# Patient Record
Sex: Female | Born: 1985 | Hispanic: Yes | Marital: Single | Smoking: Never smoker
Health system: Southern US, Community
[De-identification: ages and names within clinical notes are randomized; demographics above are authoritative.]

## PROBLEM LIST (undated history)

## (undated) HISTORY — PX: CHOLECYSTECTOMY: SHX55

---

## 2015-02-06 ENCOUNTER — Emergency Department
Admission: EM | Admit: 2015-02-06 | Discharge: 2015-02-06 | Disposition: A | Discharge status: Home / Self Care | Payer: Self-pay | Attending: Emergency Medicine | Admitting: Emergency Medicine

## 2015-02-06 ENCOUNTER — Encounter: Payer: Self-pay | Admitting: Emergency Medicine

## 2015-02-06 ENCOUNTER — Emergency Department: Payer: Self-pay

## 2015-02-06 DIAGNOSIS — Z3202 Encounter for pregnancy test, result negative: Secondary | ICD-10-CM | POA: Insufficient documentation

## 2015-02-06 DIAGNOSIS — R109 Unspecified abdominal pain: Secondary | ICD-10-CM

## 2015-02-06 DIAGNOSIS — K2970 Gastritis, unspecified, without bleeding: Secondary | ICD-10-CM | POA: Insufficient documentation

## 2015-02-06 DIAGNOSIS — F419 Anxiety disorder, unspecified: Secondary | ICD-10-CM | POA: Insufficient documentation

## 2015-02-06 LAB — URINALYSIS COMPLETE WITH MICROSCOPIC (ARMC ONLY)
BACTERIA UA: NONE SEEN
BILIRUBIN URINE: NEGATIVE
GLUCOSE, UA: NEGATIVE mg/dL
Hgb urine dipstick: NEGATIVE
Ketones, ur: NEGATIVE mg/dL
LEUKOCYTES UA: NEGATIVE
NITRITE: NEGATIVE
Protein, ur: NEGATIVE mg/dL
Specific Gravity, Urine: 1.024 (ref 1.005–1.030)
pH: 5 (ref 5.0–8.0)

## 2015-02-06 LAB — COMPREHENSIVE METABOLIC PANEL
ALBUMIN: 4.4 g/dL (ref 3.5–5.0)
ALT: 47 U/L (ref 14–54)
AST: 175 U/L — ABNORMAL HIGH (ref 15–41)
Alkaline Phosphatase: 80 U/L (ref 38–126)
Anion gap: 6 (ref 5–15)
BILIRUBIN TOTAL: 0.6 mg/dL (ref 0.3–1.2)
BUN: 15 mg/dL (ref 6–20)
CHLORIDE: 106 mmol/L (ref 101–111)
CO2: 26 mmol/L (ref 22–32)
CREATININE: 0.63 mg/dL (ref 0.44–1.00)
Calcium: 9.1 mg/dL (ref 8.9–10.3)
GLUCOSE: 107 mg/dL — AB (ref 65–99)
Potassium: 3.9 mmol/L (ref 3.5–5.1)
Sodium: 138 mmol/L (ref 135–145)
Total Protein: 8.3 g/dL — ABNORMAL HIGH (ref 6.5–8.1)

## 2015-02-06 LAB — CBC WITH DIFFERENTIAL/PLATELET
BASOS ABS: 0 10*3/uL (ref 0–0.1)
BASOS PCT: 0 %
EOS ABS: 0.2 10*3/uL (ref 0–0.7)
Eosinophils Relative: 2 %
HCT: 39 % (ref 35.0–47.0)
Hemoglobin: 12.7 g/dL (ref 12.0–16.0)
Lymphocytes Relative: 22 %
Lymphs Abs: 2.2 10*3/uL (ref 1.0–3.6)
MCH: 28.9 pg (ref 26.0–34.0)
MCHC: 32.6 g/dL (ref 32.0–36.0)
MCV: 88.7 fL (ref 80.0–100.0)
Monocytes Absolute: 0.6 10*3/uL (ref 0.2–0.9)
Monocytes Relative: 6 %
NEUTROS ABS: 7.1 10*3/uL — AB (ref 1.4–6.5)
NEUTROS PCT: 70 %
PLATELETS: 303 10*3/uL (ref 150–440)
RBC: 4.4 MIL/uL (ref 3.80–5.20)
RDW: 13.3 % (ref 11.5–14.5)
WBC: 10 10*3/uL (ref 3.6–11.0)

## 2015-02-06 LAB — POCT PREGNANCY, URINE: Preg Test, Ur: NEGATIVE

## 2015-02-06 LAB — LIPASE, BLOOD: LIPASE: 35 U/L (ref 22–51)

## 2015-02-06 MED ORDER — ONDANSETRON HCL 4 MG/2ML IJ SOLN
4.0000 mg | Freq: Once | INTRAMUSCULAR | Status: AC
  Administered 2015-02-06: 4 mg via INTRAVENOUS

## 2015-02-06 MED ORDER — SODIUM CHLORIDE 0.9 % IV SOLN
1000.0000 mL | Freq: Once | INTRAVENOUS | Status: AC
  Administered 2015-02-06: 1000 mL via INTRAVENOUS

## 2015-02-06 MED ORDER — ONDANSETRON HCL 4 MG/2ML IJ SOLN
INTRAMUSCULAR | Status: AC
  Administered 2015-02-06: 4 mg via INTRAVENOUS
  Filled 2015-02-06: qty 2

## 2015-02-06 MED ORDER — GI COCKTAIL ~~LOC~~
ORAL | Status: AC
  Administered 2015-02-06: 30 mL via ORAL
  Filled 2015-02-06: qty 30

## 2015-02-06 MED ORDER — MORPHINE SULFATE 4 MG/ML IJ SOLN
INTRAMUSCULAR | Status: AC
  Administered 2015-02-06: 4 mg via INTRAVENOUS
  Filled 2015-02-06: qty 1

## 2015-02-06 MED ORDER — MORPHINE SULFATE 4 MG/ML IJ SOLN
4.0000 mg | Freq: Once | INTRAMUSCULAR | Status: AC
  Administered 2015-02-06: 4 mg via INTRAVENOUS

## 2015-02-06 MED ORDER — PANTOPRAZOLE SODIUM 20 MG PO TBEC: 20.0000 mg | DELAYED_RELEASE_TABLET | Freq: Every day | ORAL | Status: AC

## 2015-02-06 MED ORDER — GI COCKTAIL ~~LOC~~
30.0000 mL | Freq: Once | ORAL | Status: AC
  Administered 2015-02-06: 30 mL via ORAL

## 2015-02-06 NOTE — Discharge Instructions (Signed)
Gastritis - Adultos  °(Gastritis, Adult) ° La gastrittis es la irritación (inflamación) de la membrana interna del estómago. Puede ser una enfermedad de inicio súbito (aguda) o de largo plazo (crónica). Si la gastritis no se trata, puede causar sangrado y úlceras. °CAUSAS  °La gastritis se produce cuando la membrana que tapiza interiormente al estómago se debilita o se daña. Los jugos digestivos del estómago inflaman el revestimiento del estómago debilitado. El revestimiento del estómago puede debilitarse o dañarse por una infección viral o bacteriana. La infección bacteriana más común es la infección por Helicobacter pylori. También puede ser el resultado del consumo excesivo de alcohol, por el uso de ciertos medicamentos o porque hay demasiado ácido en el estómago.  °SÍNTOMAS  °En algunos casos no hay síntomas. Si se presentan síntomas, éstos pueden ser:  °· Dolor o sensación de ardor en la parte superior del abdomen. °· Náuseas. °· Vómitos. °· Sensación molesta de distensión después de comer. °DIAGNÓSTICO  °El médico puede diagnosticar gastritis según los síntomas y el examen físico. Para determinar la causa de la gastritis, el médico podrá:  °· Pedir análisis de sangre o de materia fecal para diagnosticar la presencia de la bacteria H pylori. °· Gastroscopía. Un tubo delgado y flexible (endoscopio) se pasa por el esófago hasta llegar al estómago. El endoscopio tiene una luz y una cámara en el extremo. El médico utilizará el endoscopio para observar el interior del estómago. °· Tomará una muestra de tejido (biopsia) del estómago para examinarlo en el microscopio. °TRATAMIENTO  °Según la causa de la gastritis podrán recetarle: Antibióticos, si la causa es una infección bacteriana, como una infección por H. pylori. Antiácidos o bloqueadores H2, si hay demasiado ácido en el estómago. El médico le aconsejará que deje de tomar aspirina, ibuprofeno u otros antiinflamatorios no esteroides (AINE).  °INSTRUCCIONES PARA EL  CUIDADO EN EL HOGAR  °· Tome sólo medicamentos de venta libre o recetados, según las indicaciones del médico. °· Si le han recetado antibióticos, tómelos según las indicaciones. Tómelos todos, aunque se sienta mejor. °· Debe ingerir gran cantidad de líquido para mantener la orina de tono claro o color amarillo pálido. °· Evite las comidas y bebidas que empeoran los problemas, como: °¨ Bebidas con cafeína o alcohólicas. °¨ Chocolate. °¨ Sabores a menta. °¨ Ajo y cebolla. °¨ Comidas muy condimentadas. °¨ Cítricos como naranjas, limones o limas. °¨ Alimentos que contengan tomate, como salsas, chile y pizza. °¨ Alimentos fritos y grasos. °· Haga comidas pequeñas durante el día en lugar de 3 comidas abundantes. °SOLICITE ATENCIÓN MÉDICA DE INMEDIATO SI:  °· La materia fecal es negra o de color rojo oscuro. °· Vomita sangre de color rojo brillante o material similar a granos de café. °· No puede retener los líquidos. °· El dolor abdominal empeora. °· Tiene fiebre. °· No mejora luego de 1 semana. °· Tiene preguntas o preocupaciones. °ASEGÚRESE DE QUE:  °· Comprende estas instrucciones. °· Controlará su enfermedad. °· Solicitará ayuda de inmediato si no mejora o si empeora. °Document Released: 05/22/2005 Document Revised: 05/06/2012 °Exit[employer]® Patient Information ©2015 [employer]. This information is not intended to replace advice given to you by your health care provider. Make sure you discuss any questions you have with your health care provider. ° °

## 2015-02-06 NOTE — ED Notes (Signed)
Initial arrival awaiting interpreter, pt does states nausea and abdominal pain, unable to discern for how long

## 2015-02-06 NOTE — ED Provider Notes (Signed)
[facility] Emergency Department Provider Note  ____________________________________________  Time seen: 3:15 PM  I have reviewed the triage vital signs and the nursing notes.   HISTORY  Chief Complaint Abdominal Pain   Spanish interpreter used   HPI Briana Watson is a 29 y.o. female who presents with upper abdominal pain that started yesterday. She reports the pain is severe and cramping in nature. She has had this several times in the past and she does not know what causes it. She reports nausea and vomiting. She has been told that she has gastritis when she lived in [place] She has never had abdominal surgery. No fevers no chills.Eating makes the pain worse     History reviewed. No pertinent past medical history.  There are no active problems to display for this patient.   Past Surgical History  Procedure Laterality Date  . Cesarean section      No current outpatient prescriptions on file.  Allergies Review of patient's allergies indicates no known allergies.  No family history on file.  Social History History  Substance Use Topics  . Smoking status: Never Smoker   . Smokeless tobacco: Not on file  . Alcohol Use: No    Review of Systems  Constitutional: Negative for fever. Eyes: Negative for visual changes. ENT: Negative for sore throat Cardiovascular: Negative for chest pain. Respiratory: Negative for shortness of breath. Gastrointestinal: Positive for abdominal pain, nausea and vomiting Genitourinary: Negative for dysuria. Musculoskeletal: Negative for back pain. Skin: Negative for rash. Neurological: Negative for headaches or focal weakness Psychiatric: Positive anxiety  10-point ROS otherwise negative.  ____________________________________________   PHYSICAL EXAM:  VITAL SIGNS: ED Triage Vitals  Enc Vitals Group     BP 02/06/15 1349 120/61 mmHg     Pulse Rate 02/06/15 1349 87     Resp 02/06/15 1349 20      Temp 02/06/15 1349 98.7 F (37.1 C)     Temp Source 02/06/15 1349 Oral     SpO2 02/06/15 1349 98 %     Weight 02/06/15 1352 264 lb (119.75 kg)     Height 02/06/15 1352 5\' 3"  (1.6 m)     Head Cir --      Peak Flow --      Pain Score 02/06/15 1353 1     Pain Loc --      Pain Edu? --      Excl. in GC? --      Constitutional: Alert and oriented. Well appearing and in no distress. Eyes: Conjunctivae are normal. PERRL. ENT   Head: Normocephalic and atraumatic.   Nose: No rhinnorhea.   Mouth/Throat: Mucous membranes are moist. Cardiovascular: Normal rate, regular rhythm. Normal and symmetric distal pulses are present in all extremities. No murmurs, rubs, or gallops. Respiratory: Normal respiratory effort without tachypnea nor retractions. Breath sounds are clear and equal bilaterally.  Gastrointestinal: Tenderness to palpation in the right upper quadrant and epigastrium. No distention. There is no CVA tenderness. Genitourinary: deferred Musculoskeletal: Nontender with normal range of motion in all extremities. No lower extremity tenderness nor edema. Neurologic:  Normal speech and language. No gross focal neurologic deficits are appreciated. Skin:  Skin is warm, dry and intact. No rash noted. Psychiatric: Mood and affect are normal. Patient exhibits appropriate insight and judgment.  ____________________________________________    LABS (pertinent positives/negatives)  Labs Reviewed  CBC WITH DIFFERENTIAL/PLATELET - Abnormal; Notable for the following:    Neutro Abs 7.1 (*)    All other components  within normal limits  COMPREHENSIVE METABOLIC PANEL - Abnormal; Notable for the following:    Glucose, Bld 107 (*)    Total Protein 8.3 (*)    AST 175 (*)    All other components within normal limits  URINALYSIS COMPLETEWITH MICROSCOPIC ([facility] ONLY) - Abnormal; Notable for the following:    Color, Urine YELLOW (*)    APPearance CLEAR (*)    Squamous Epithelial / LPF 0-5  (*)    All other components within normal limits  LIPASE, BLOOD  POCT PREGNANCY, URINE  POC URINE PREG, ED    ____________________________________________   EKG  None  ____________________________________________    RADIOLOGY  Ultrasound shows gallstones and mildly dilated common bile duct, the patient's bilirubin is normal and in fact her pain has improved  ____________________________________________   PROCEDURES  Procedure(s) performed: none  Critical Care performed: none  ____________________________________________   INITIAL IMPRESSION / ASSESSMENT AND PLAN / ED COURSE  Pertinent labs & imaging results that were available during my care of the patient were reviewed by me and considered in my medical decision making (see chart for details).  Patient with significant epigastric tenderness to palpation. Differential diagnosis includes cholecystitis, gastritis, pancreatitis. We will obtain ultrasound, blood work and give IV morphine and IV Zofran  ____________________________________________ ----------------------------------------- 6:17 PM on 02/06/2015 -----------------------------------------  Patient also received GI cocktail which seemed to help her pain is well area given history of gastritis and quick response to GI cocktail I suspect that this is related to gastritis. Discussed ultrasound findings with the patient via interpreter she follow up with GI as recommended. She also understands the return precautions. We'll put her on a PPI  FINAL CLINICAL IMPRESSION(S) / ED DIAGNOSES  Final diagnoses:  Abdominal pain  Gastritis     Jene Every, MD 02/06/15 1819

## 2015-02-06 NOTE — ED Notes (Signed)
On interpreter arrival states nausea, vomiting and abd pain since yesterday

## 2016-10-09 DIAGNOSIS — K819 Cholecystitis, unspecified: Secondary | ICD-10-CM | POA: Insufficient documentation

## 2017-06-12 ENCOUNTER — Emergency Department
Admission: EM | Admit: 2017-06-12 | Discharge: 2017-06-12 | Disposition: A | Discharge status: Home / Self Care | Payer: Worker's Compensation | Attending: Emergency Medicine | Admitting: Emergency Medicine

## 2017-06-12 ENCOUNTER — Emergency Department: Payer: Worker's Compensation

## 2017-06-12 ENCOUNTER — Encounter: Payer: Self-pay | Admitting: Emergency Medicine

## 2017-06-12 DIAGNOSIS — Y92512 Supermarket, store or market as the place of occurrence of the external cause: Secondary | ICD-10-CM | POA: Diagnosis not present

## 2017-06-12 DIAGNOSIS — Y990 Civilian activity done for income or pay: Secondary | ICD-10-CM | POA: Insufficient documentation

## 2017-06-12 DIAGNOSIS — M79602 Pain in left arm: Secondary | ICD-10-CM | POA: Insufficient documentation

## 2017-06-12 DIAGNOSIS — Y9389 Activity, other specified: Secondary | ICD-10-CM | POA: Diagnosis not present

## 2017-06-12 DIAGNOSIS — W010XXA Fall on same level from slipping, tripping and stumbling without subsequent striking against object, initial encounter: Secondary | ICD-10-CM | POA: Insufficient documentation

## 2017-06-12 DIAGNOSIS — S4992XA Unspecified injury of left shoulder and upper arm, initial encounter: Secondary | ICD-10-CM | POA: Insufficient documentation

## 2017-06-12 DIAGNOSIS — W19XXXA Unspecified fall, initial encounter: Secondary | ICD-10-CM

## 2017-06-12 MED ORDER — HYDROCODONE-ACETAMINOPHEN 5-325 MG PO TABS: 1.0000 | ORAL_TABLET | Freq: Four times a day (QID) | ORAL | 0 refills | Status: DC | PRN

## 2017-06-12 MED ORDER — HYDROCODONE-ACETAMINOPHEN 5-325 MG PO TABS
1.0000 | ORAL_TABLET | Freq: Once | ORAL | Status: AC
  Administered 2017-06-12: 1 via ORAL
  Filled 2017-06-12: qty 1

## 2017-06-12 MED ORDER — KETOROLAC TROMETHAMINE 30 MG/ML IJ SOLN
60.0000 mg | Freq: Once | INTRAMUSCULAR | Status: AC
  Administered 2017-06-12: 60 mg via INTRAMUSCULAR
  Filled 2017-06-12: qty 2

## 2017-06-12 MED ORDER — IBUPROFEN 800 MG PO TABS: 800.0000 mg | ORAL_TABLET | Freq: Three times a day (TID) | ORAL | 0 refills | Status: AC | PRN

## 2017-06-12 NOTE — ED Notes (Signed)
Pt. Verbalizes understanding of d/c instructions, medications, and follow-up. VS stable.  Pt. In NAD at time of d/c and denies further concerns regarding this visit. Pt. Stable at the time of departure from the unit, departing unit by the safest and most appropriate manner per that pt condition and limitations with all belongings accounted for. Pt advised to return to the ED at any time for emergent concerns, or for new/worsening symptoms.   

## 2017-06-12 NOTE — ED Provider Notes (Signed)
[facility] Emergency Department Provider Note   ____________________________________________   First MD Initiated Contact with Patient 06/12/17 [id number]     (approximate)  I have reviewed the triage vital signs and the nursing notes.   HISTORY  Chief Complaint Arm Injury  history obtained via Spanish interpreter  HPI Briana Watson is a 31 y.o. female who presents to the ED from work with a chief complaint of fall with left arm pain. Patient reports she was pulling on a pallet, fell backwards and landed on her left arm to protect her head. Denies striking head or LOC. Complains ofpain to her left shoulder and forearm. Denies associated extremity weakness, numbness or tingling. Denies vision changes, neck pain, chest pain, shortness breath, abdominal pain, nausea and vomiting. Movement makes her pain worse.   Past medical history None  There are no active problems to display for this patient.   Past Surgical History:  Procedure Laterality Date  . CESAREAN SECTION    . CHOLECYSTECTOMY      Prior to Admission medications   Medication Sig Start Date End Date Taking? Authorizing Provider  HYDROcodone-acetaminophen (NORCO) 5-325 MG tablet Take 1 tablet by mouth every 6 (six) hours as needed for moderate pain. 06/12/17   Irean HongSung, Artia Singley J, MD  ibuprofen (ADVIL,MOTRIN) 800 MG tablet Take 1 tablet (800 mg total) by mouth every 8 (eight) hours as needed for moderate pain. 06/12/17   Irean HongSung, Kash Davie J, MD  pantoprazole (PROTONIX) 20 MG tablet Take 1 tablet (20 mg total) by mouth daily. 02/06/15 02/06/16  Jene EveryKinner, Robert, MD    Allergies Patient has no known allergies.  No family history on file.  Social History Social History  Substance Use Topics  . Smoking status: Never Smoker  . Smokeless tobacco: Never Used  . Alcohol use No    Review of Systems  Constitutional: No fever/chills. Eyes: No visual changes. ENT: No sore throat. Cardiovascular: Denies  chest pain. Respiratory: Denies shortness of breath. Gastrointestinal: No abdominal pain.  No nausea, no vomiting.  No diarrhea.  No constipation. Genitourinary: Negative for dysuria. Musculoskeletal: positive for left arm pain. Negative for back pain. Skin: Negative for rash. Neurological: Negative for headaches, focal weakness or numbness.   ____________________________________________   PHYSICAL EXAM:  VITAL SIGNS: ED Triage Vitals [06/12/17 0420]  Enc Vitals Group     BP 119/69     Pulse Rate 89     Resp 18     Temp 97.7 F (36.5 C)     Temp Source Oral     SpO2 100 %     Weight 284 lb (128.8 kg)     Height      Head Circumference      Peak Flow      Pain Score 9     Pain Loc      Pain Edu?      Excl. in GC?     Constitutional: Alert and oriented. Well appearing and in mild acute distress. Eyes: Conjunctivae are normal. PERRL. EOMI. Head: Atraumatic. Nose: No congestion/rhinnorhea. Mouth/Throat: Mucous membranes are moist.  Oropharynx non-erythematous. Neck: No stridor.  No cervical spine tenderness to palpation. Cardiovascular: Normal rate, regular rhythm. Grossly normal heart sounds.  Good peripheral circulation. Respiratory: Normal respiratory effort.  No retractions. Lungs CTAB. Gastrointestinal: Soft and nontender. No distention. No abdominal bruits. No CVA tenderness. Musculoskeletal: Left shoulder and forearm tender to palpation. No external deformities or evidence of injury. No swelling. Limited range of motion particularly on  extension. Arm held in adduction and internal rotation for comfort. 2+ radial pulses. Brisk, less than 5 second capillary refill. Neurologic:  Normal speech and language. No gross focal neurologic deficits are appreciated. No gait instability. Skin:  Skin is warm, dry and intact. No rash noted. Psychiatric: Mood and affect are normal. Speech and behavior are normal.  ____________________________________________   LABS (all labs  ordered are listed, but only abnormal results are displayed)  Labs Reviewed - No data to display ____________________________________________  EKG  None ____________________________________________  RADIOLOGY  Dg Forearm Left  Result Date: 06/12/2017 CLINICAL DATA:  Pain after a fall. EXAM: LEFT FOREARM - 2 VIEW COMPARISON:  None. FINDINGS: There is no evidence of fracture or other focal bone lesions. Soft tissues are unremarkable. IMPRESSION: Negative. Electronically Signed   By: Burman Nieves M.D.   On: 06/12/2017 05:46   Dg Shoulder Left  Result Date: 06/12/2017 CLINICAL DATA:  Left shoulder pain after injury due to a fall. EXAM: LEFT SHOULDER - 2+ VIEW COMPARISON:  None. FINDINGS: There is no evidence of fracture or dislocation. There is no evidence of arthropathy or other focal bone abnormality. Soft tissues are unremarkable. IMPRESSION: Negative. Electronically Signed   By: Burman Nieves M.D.   On: 06/12/2017 05:45    ____________________________________________   PROCEDURES  Procedure(s) performed: None  Procedures  Critical Care performed: No  ____________________________________________   INITIAL IMPRESSION / ASSESSMENT AND PLAN / ED COURSE  As part of my medical decision making, I reviewed the following data within the electronic MEDICAL RECORD NUMBER Nursing notes reviewed and incorporated, Radiograph reviewed and Notes from prior ED visits.   31 year old female who presents from work status post fall with left arm injury. X-rays negative for acute traumatic injury. Will place in sling, NSAIDs, analgesia and encouraged orthopedics follow-up. Strict return precautions given. She verbalizes understanding and agrees with that of care.      ____________________________________________   FINAL CLINICAL IMPRESSION(S) / ED DIAGNOSES  Final diagnoses:  Fall, initial encounter  Injury of left upper extremity, initial encounter  Left arm pain      NEW  MEDICATIONS STARTED DURING THIS VISIT:  New Prescriptions   HYDROCODONE-ACETAMINOPHEN (NORCO) 5-325 MG TABLET    Take 1 tablet by mouth every 6 (six) hours as needed for moderate pain.   IBUPROFEN (ADVIL,MOTRIN) 800 MG TABLET    Take 1 tablet (800 mg total) by mouth every 8 (eight) hours as needed for moderate pain.     Note:  This document was prepared using Dragon voice recognition software and may include unintentional dictation errors.    Irean Hong, MD 06/12/17 [phone]

## 2017-06-12 NOTE — ED Notes (Signed)
ED Provider at bedside. 

## 2017-06-12 NOTE — ED Triage Notes (Addendum)
Patient ambulatory to triage with steady gait, without difficulty or distress noted; pt st employeed with [employer]; pt reports pain to left shoulder & FA after pulling on a pallet, and falling backwards while at work PTA; Briana FanningJulie, EDT to triage to complete workers comp profile

## 2017-06-12 NOTE — Discharge Instructions (Signed)
1. You may take pain medicines as needed (Motrin/Norco #15). 2. Apply ice several times daily as needed. 3. Wear sling as needed for comfort. 4. Return to the ER for worsening symptoms, persistent vomiting, difficulty breathing or other concerns.

## 2017-09-23 DIAGNOSIS — S52043A Displaced fracture of coronoid process of unspecified ulna, initial encounter for closed fracture: Secondary | ICD-10-CM | POA: Insufficient documentation

## 2018-01-09 DIAGNOSIS — G5620 Lesion of ulnar nerve, unspecified upper limb: Secondary | ICD-10-CM | POA: Insufficient documentation

## 2018-05-23 IMAGING — CR DG SHOULDER 2+V*L*
3 series · 4 of 4 positions shown · non-contrast
Comparison: None.

CLINICAL DATA: Left shoulder pain after injury due to a fall.

EXAM:
LEFT SHOULDER - 2+ VIEW

[shoulder grashey]
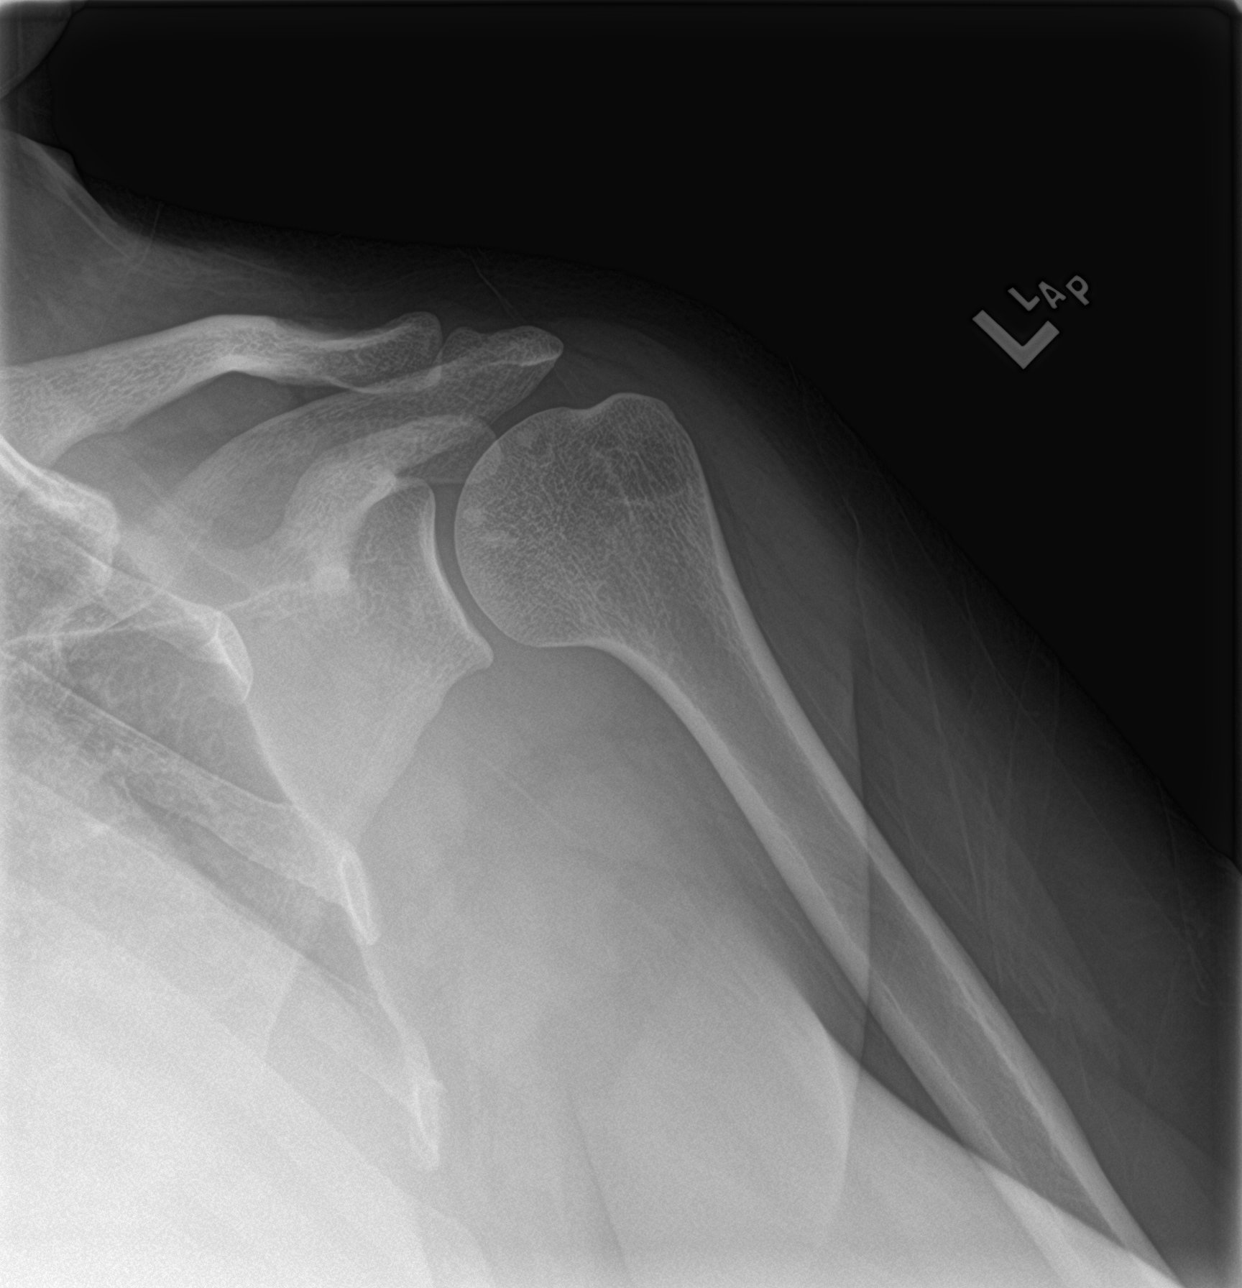

[Series 2: shoulder y view · 0.14mm/px · 2 of 2 slices shown]
[im 1/2]
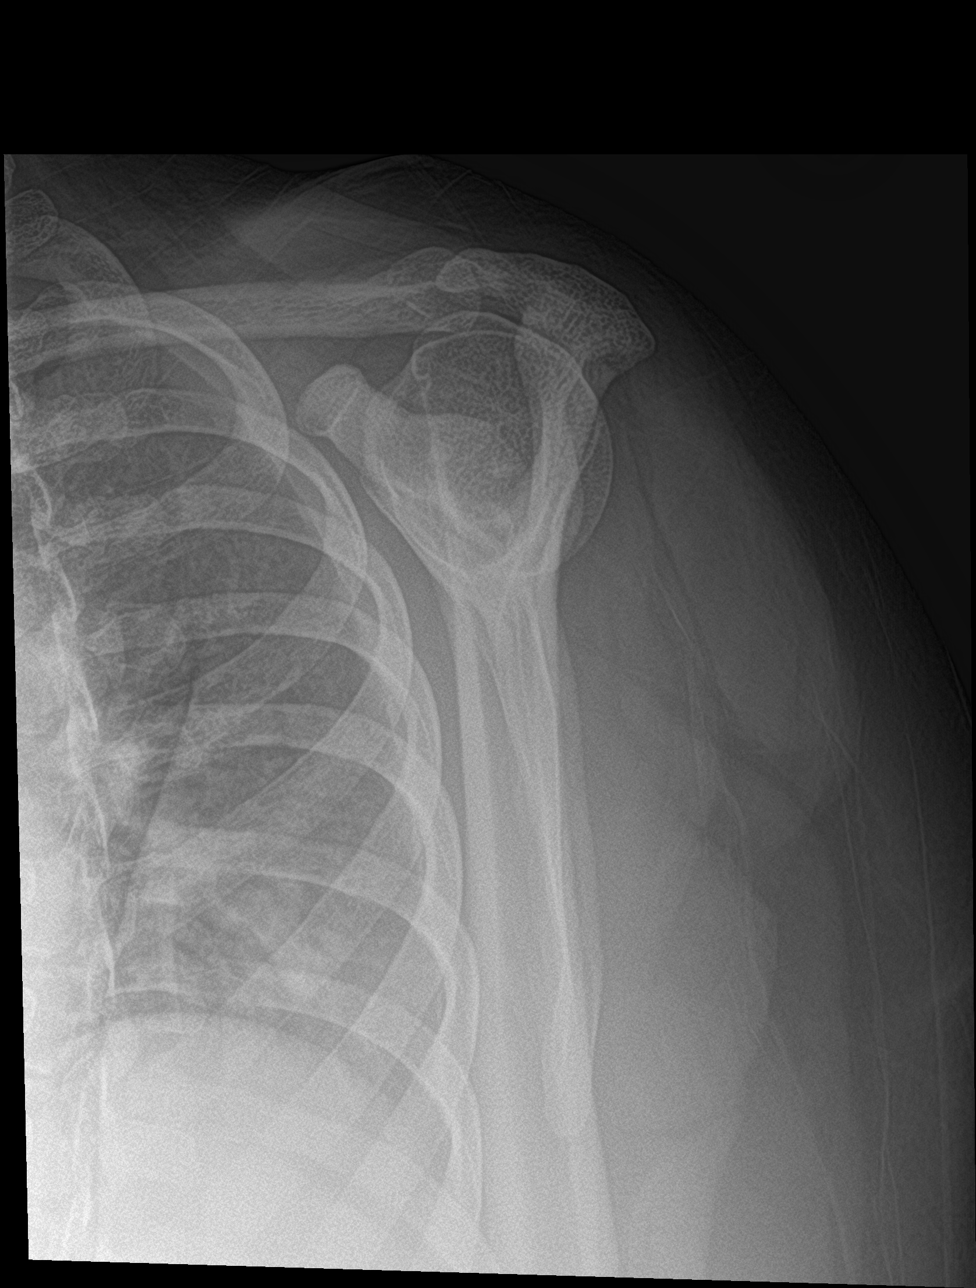
[im 2/2]
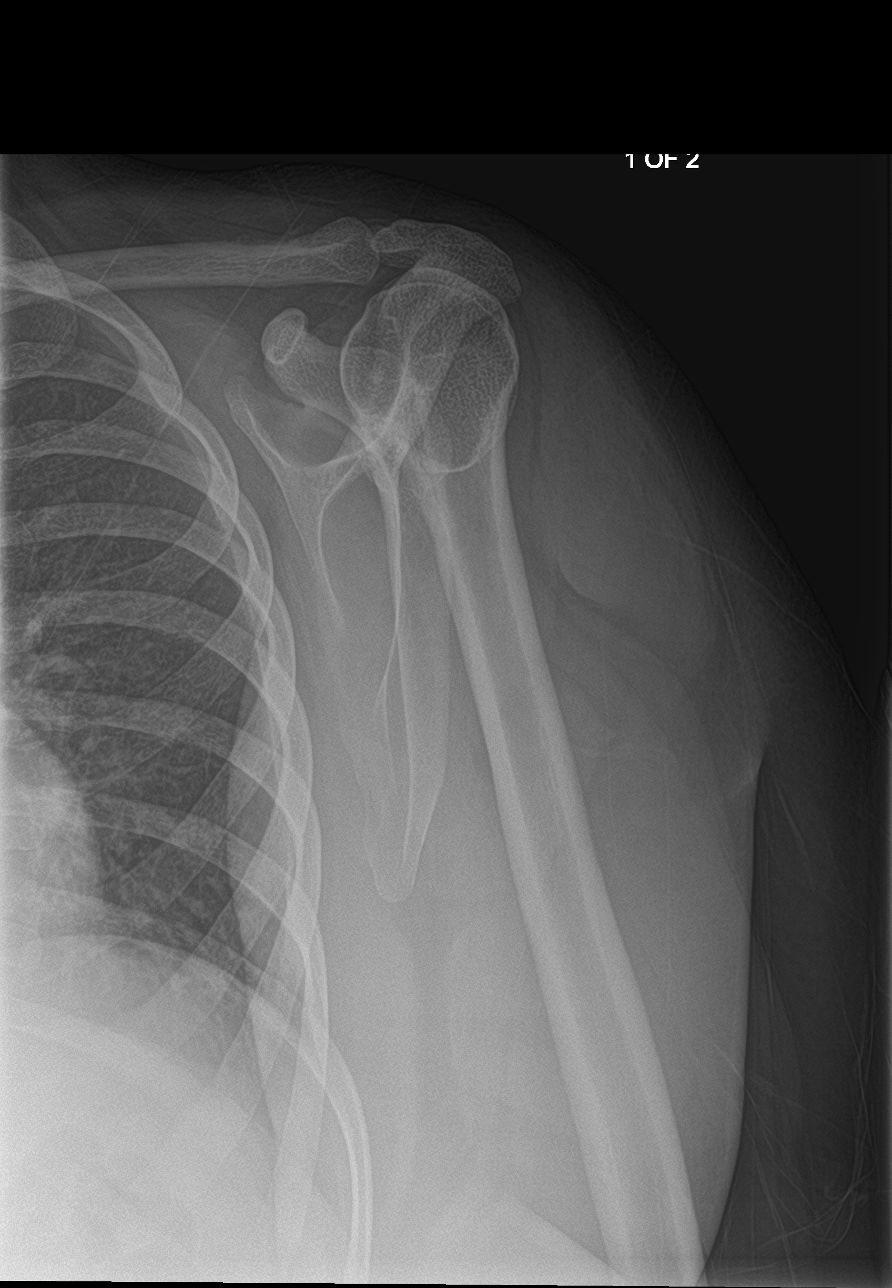

[shoulder axillary]
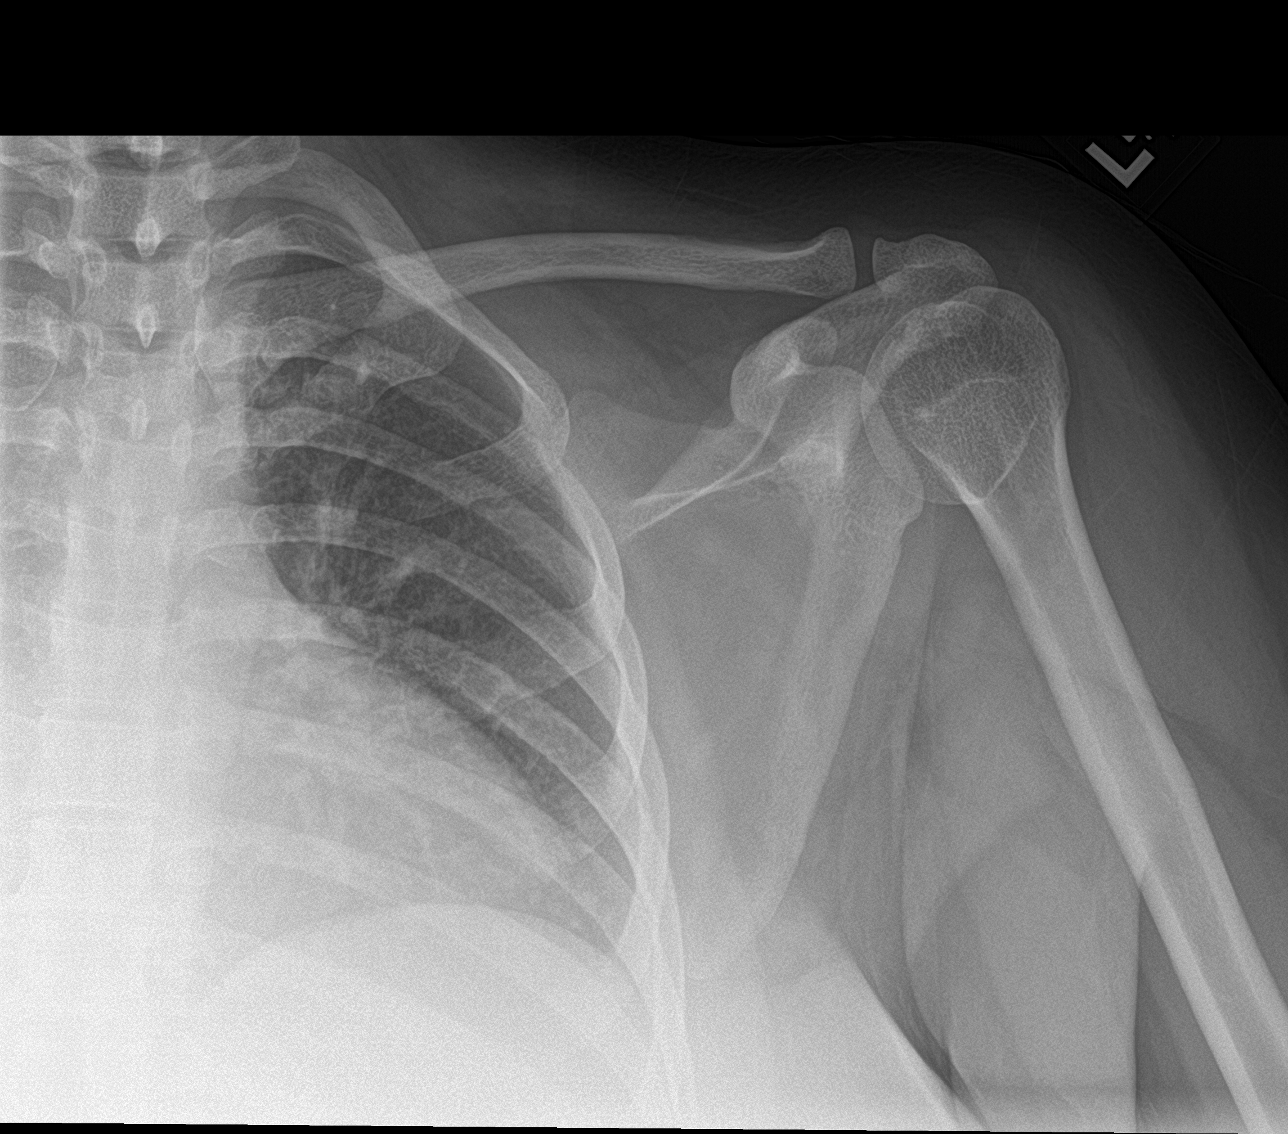

[4 of 4 positions shown; findings below may reference images not displayed]

FINDINGS: There is no evidence of fracture or dislocation. There is no
evidence of arthropathy or other focal bone abnormality. Soft
tissues are unremarkable.
IMPRESSION: Negative.

## 2020-07-04 ENCOUNTER — Encounter: Payer: Self-pay | Admitting: *Deleted

## 2020-07-04 ENCOUNTER — Emergency Department
Admission: EM | Admit: 2020-07-04 | Discharge: 2020-07-04 | Disposition: A | Discharge status: Home / Self Care | Payer: Self-pay | Attending: Emergency Medicine | Admitting: Emergency Medicine

## 2020-07-04 ENCOUNTER — Other Ambulatory Visit: Payer: Self-pay

## 2020-07-04 ENCOUNTER — Emergency Department: Payer: Self-pay

## 2020-07-04 DIAGNOSIS — R0602 Shortness of breath: Secondary | ICD-10-CM

## 2020-07-04 DIAGNOSIS — J40 Bronchitis, not specified as acute or chronic: Secondary | ICD-10-CM | POA: Insufficient documentation

## 2020-07-04 LAB — COMPREHENSIVE METABOLIC PANEL
ALT: 20 U/L (ref 0–44)
AST: 31 U/L (ref 15–41)
Albumin: 3.9 g/dL (ref 3.5–5.0)
Alkaline Phosphatase: 85 U/L (ref 38–126)
Anion gap: 11 (ref 5–15)
BUN: 17 mg/dL (ref 6–20)
CO2: 23 mmol/L (ref 22–32)
Calcium: 8.6 mg/dL — ABNORMAL LOW (ref 8.9–10.3)
Chloride: 103 mmol/L (ref 98–111)
Creatinine, Ser: 0.8 mg/dL (ref 0.44–1.00)
GFR, Estimated: >60 mL/min (ref >60)
Glucose, Bld: 113 mg/dL — ABNORMAL HIGH (ref 70–99)
Potassium: 4.5 mmol/L (ref 3.5–5.1)
Sodium: 137 mmol/L (ref 135–145)
Total Bilirubin: 0.6 mg/dL (ref 0.3–1.2)
Total Protein: 7.7 g/dL (ref 6.5–8.1)

## 2020-07-04 LAB — CBC WITH DIFFERENTIAL/PLATELET
Abs Immature Granulocytes: 0.06 10*3/uL (ref 0.00–0.07)
Basophils Absolute: 0.1 10*3/uL (ref 0.0–0.1)
Basophils Relative: 1 %
Eosinophils Absolute: 0.3 10*3/uL (ref 0.0–0.5)
Eosinophils Relative: 4 %
HCT: 38.1 % (ref 36.0–46.0)
Hemoglobin: 12.6 g/dL (ref 12.0–15.0)
Immature Granulocytes: 1 %
Lymphocytes Relative: 28 %
Lymphs Abs: 2.3 10*3/uL (ref 0.7–4.0)
MCH: 29.7 pg (ref 26.0–34.0)
MCHC: 33.1 g/dL (ref 30.0–36.0)
MCV: 89.9 fL (ref 80.0–100.0)
Monocytes Absolute: 0.7 10*3/uL (ref 0.1–1.0)
Monocytes Relative: 9 %
Neutro Abs: 4.9 10*3/uL (ref 1.7–7.7)
Neutrophils Relative %: 57 %
Platelets: 302 10*3/uL (ref 150–400)
RBC: 4.24 MIL/uL (ref 3.87–5.11)
RDW: 12.7 % (ref 11.5–15.5)
WBC: 8.3 10*3/uL (ref 4.0–10.5)
nRBC: 0 % (ref 0.0–0.2)

## 2020-07-04 LAB — TROPONIN I (HIGH SENSITIVITY): Troponin I (High Sensitivity): <2 ng/L (ref <18)

## 2020-07-04 LAB — FIBRIN DERIVATIVES D-DIMER (ARMC ONLY): Fibrin derivatives D-dimer (ARMC): 258.89 ng/mL (FEU) (ref 0.00–499.00)

## 2020-07-04 MED ORDER — IPRATROPIUM-ALBUTEROL 0.5-2.5 (3) MG/3ML IN SOLN
3.0000 mL | Freq: Once | RESPIRATORY_TRACT | Status: AC
  Administered 2020-07-04: 3 mL via RESPIRATORY_TRACT
  Filled 2020-07-04: qty 3

## 2020-07-04 MED ORDER — ALBUTEROL SULFATE HFA 108 (90 BASE) MCG/ACT IN AERS: 2.0000 | INHALATION_SPRAY | Freq: Four times a day (QID) | RESPIRATORY_TRACT | 2 refills | Status: AC | PRN

## 2020-07-04 NOTE — ED Notes (Signed)
Pt to XR

## 2020-07-04 NOTE — ED Triage Notes (Addendum)
Pt to ED reporting she took a tylenol 30-35 minutes ago for a headache and is now feeling SOB and dizzy with generalized chest pain. Pt reports she has been congested since Saturday. Productive cough with clear sputum. No fevers at home. No hives or swelling noted. Headache has subsided   Pt reports she has taken tylenol in the past without problems.   Pt added throughout triage that she has been having a similar reaction to medications since having COVID last year.

## 2020-07-04 NOTE — ED Provider Notes (Signed)
[facility] Emergency Department Provider Note   ____________________________________________   First MD Initiated Contact with Patient 07/04/20 0411     (approximate)  I have reviewed the triage vital signs and the nursing notes.   HISTORY  Chief Complaint Shortness of Breath    HPI Briana Watson is a 34 y.o. female with a past medical history of anxiety who presents for shortness of breath that occurred starting tonight and has been stable since onset.  Patient states that she took a Tylenol approximately 30 minutes prior to the onset of shortness of breath.  Patient states that she has been having "cold symptoms" of cough, chills, runny nose, and sore throat over the past 2-3 days.  Patient denies any recent sick contacts.  Patient states she also has associated chest tightness that is not associated with exertion.  She describes this pain as 5/10, nonradiating, tightness that has much improved since onset         History reviewed. No pertinent past medical history.  There are no problems to display for this patient.   Past Surgical History:  Procedure Laterality Date  . CESAREAN SECTION    . CHOLECYSTECTOMY      Prior to Admission medications   Medication Sig Start Date End Date Taking? Authorizing Provider  albuterol (VENTOLIN HFA) 108 (90 Base) MCG/ACT inhaler Inhale 2 puffs into the lungs every 6 (six) hours as needed for wheezing or shortness of breath. 07/04/20   Merwyn Katos, MD  HYDROcodone-acetaminophen (NORCO) 5-325 MG tablet Take 1 tablet by mouth every 6 (six) hours as needed for moderate pain. 06/12/17   Irean Hong, MD  ibuprofen (ADVIL,MOTRIN) 800 MG tablet Take 1 tablet (800 mg total) by mouth every 8 (eight) hours as needed for moderate pain. 06/12/17   Irean Hong, MD  pantoprazole (PROTONIX) 20 MG tablet Take 1 tablet (20 mg total) by mouth daily. 02/06/15 02/06/16  Jene Every, MD    Allergies Patient has no  known allergies.  History reviewed. No pertinent family history.  Social History Social History   Tobacco Use  . Smoking status: Never Smoker  . Smokeless tobacco: Never Used  Substance Use Topics  . Alcohol use: No  . Drug use: Not on file    Review of Systems Constitutional: No fever/chills Eyes: No visual changes. ENT: No sore throat. Cardiovascular: Endorses chest pain. Respiratory: Endorses shortness of breath. Gastrointestinal: No abdominal pain.  No nausea, no vomiting.  No diarrhea. Genitourinary: Negative for dysuria. Musculoskeletal: Negative for acute arthralgias Skin: Negative for rash. Neurological: Negative for headaches, weakness/numbness/paresthesias in any extremity Psychiatric: Negative for suicidal ideation/homicidal ideation   ____________________________________________   PHYSICAL EXAM:  VITAL SIGNS: ED Triage Vitals [07/04/20 0401]  Enc Vitals Group     BP (!) 110/94     Pulse Rate (!) 102     Resp 20     Temp 98 F (36.7 C)     Temp Source Oral     SpO2 97 %     Weight      Height      Head Circumference      Peak Flow      Pain Score      Pain Loc      Pain Edu?      Excl. in GC?    Constitutional: Alert and oriented. Well appearing and in no acute distress. Eyes: Conjunctivae are normal. PERRL. Head: Atraumatic. Nose: No congestion/rhinnorhea. Mouth/Throat: Mucous membranes are  moist. Neck: No stridor Cardiovascular: Grossly normal heart sounds.  Good peripheral circulation. Respiratory: Expiratory wheezes over bilateral lung fields.  Normal respiratory effort.  No retractions. Gastrointestinal: Soft and nontender. No distention. Musculoskeletal: No obvious deformities Neurologic:  Normal speech and language. No gross focal neurologic deficits are appreciated. Skin:  Skin is warm and dry. No rash noted. Psychiatric: Mood and affect are normal. Speech and behavior are normal.  ____________________________________________     LABS (all labs ordered are listed, but only abnormal results are displayed)  Labs Reviewed  COMPREHENSIVE METABOLIC PANEL - Abnormal; Notable for the following components:      Result Value   Glucose, Bld 113 (*)    Calcium 8.6 (*)    All other components within normal limits  CBC WITH DIFFERENTIAL/PLATELET  FIBRIN DERIVATIVES D-DIMER ([facility] ONLY)  TROPONIN I (HIGH SENSITIVITY)   ____________________________________________  EKG  ED ECG REPORT I, Merwyn Katos, the attending physician, personally viewed and interpreted this ECG.  Date: 07/04/2020 EKG Time: 0410 Rate: 86 Rhythm: normal sinus rhythm QRS Axis: normal Intervals: normal ST/T Wave abnormalities: normal Narrative Interpretation: no evidence of acute ischemia  ____________________________________________  RADIOLOGY  ED MD interpretation: 2 view x-ray of the chest shows no evidence of acute abnormalities including no pneumonia, pneumothorax, or widened mediastinum  Official radiology report(s): DG Chest 2 View  Result Date: 07/04/2020 CLINICAL DATA:  Shortness of breath EXAM: CHEST - 2 VIEW COMPARISON:  None. FINDINGS: Normal heart size and mediastinal contours. No acute infiltrate or edema. No effusion or pneumothorax. No acute osseous findings. IMPRESSION: Negative chest. Electronically Signed   By: Marnee Spring M.D.   On: 07/04/2020 05:10    ____________________________________________   PROCEDURES  Procedure(s) performed (including Critical Care):  .1-3 Lead EKG Interpretation Performed by: Merwyn Katos, MD Authorized by: Merwyn Katos, MD     Interpretation: normal     ECG rate:  89   ECG rate assessment: normal     Rhythm: sinus rhythm     Ectopy: none     Conduction: normal       ____________________________________________   INITIAL IMPRESSION / ASSESSMENT AND PLAN / ED COURSE  As part of my medical decision making, I reviewed the following data within the electronic medical  record:  Nursing notes reviewed and incorporated, Labs reviewed, EKG interpreted, Old chart reviewed, Radiograph reviewed and Notes from prior ED visits reviewed and incorporated        The patient is suffering from shortness of breath, but the immediate cause is not apparent.  Given patient's expiratory wheezing on exam and history of cold-like symptoms is likely the patient has bronchitis with wheezing  Potential causes considered include, but are not limited to, asthma or COPD, congestive heart failure, pulmonary embolism, pneumothorax, coronary syndrome, pneumonia, and pleural effusion.  Despite the evaluation including history, exam, and testing, the cause of the shortness of breath remains unclear. However, during the ED stay, patients condition improved with a breathing treatment, and at the time of discharge the shortness of breath is resolved, they are feeling well, and want to go home.  Patient will be discharged with strict return precautions and advice to follow up with primary MD within 24 hours for further evaluation.      ____________________________________________   FINAL CLINICAL IMPRESSION(S) / ED DIAGNOSES  Final diagnoses:  SOB (shortness of breath)  Bronchitis     ED Discharge Orders         Ordered  albuterol (VENTOLIN HFA) 108 (90 Base) MCG/ACT inhaler  Every 6 hours PRN        07/04/20 0552           Note:  This document was prepared using Dragon voice recognition software and may include unintentional dictation errors.   Merwyn Katos, MD 07/04/20 [phone]

## 2021-02-03 ENCOUNTER — Other Ambulatory Visit: Payer: Self-pay

## 2021-02-03 ENCOUNTER — Emergency Department
Admission: EM | Admit: 2021-02-03 | Discharge: 2021-02-04 | Disposition: A | Discharge status: Home / Self Care | Payer: Self-pay | Attending: Emergency Medicine | Admitting: Emergency Medicine

## 2021-02-03 ENCOUNTER — Emergency Department: Payer: Self-pay

## 2021-02-03 ENCOUNTER — Encounter: Payer: Self-pay | Admitting: Emergency Medicine

## 2021-02-03 DIAGNOSIS — M5442 Lumbago with sciatica, left side: Secondary | ICD-10-CM | POA: Insufficient documentation

## 2021-02-03 DIAGNOSIS — M5432 Sciatica, left side: Secondary | ICD-10-CM

## 2021-02-03 LAB — POC URINE PREG, ED: Preg Test, Ur: NEGATIVE

## 2021-02-03 MED ORDER — KETOROLAC TROMETHAMINE 10 MG PO TABS: 10.0000 mg | ORAL_TABLET | Freq: Three times a day (TID) | ORAL | 0 refills | Status: AC

## 2021-02-03 MED ORDER — CYCLOBENZAPRINE HCL 5 MG PO TABS: 5.0000 mg | ORAL_TABLET | Freq: Three times a day (TID) | ORAL | 0 refills | Status: DC | PRN

## 2021-02-03 MED ORDER — ORPHENADRINE CITRATE 30 MG/ML IJ SOLN
60.0000 mg | INTRAMUSCULAR | Status: AC
  Administered 2021-02-03: 60 mg via INTRAMUSCULAR
  Filled 2021-02-03: qty 2

## 2021-02-03 MED ORDER — KETOROLAC TROMETHAMINE 30 MG/ML IJ SOLN
30.0000 mg | Freq: Once | INTRAMUSCULAR | Status: AC
  Administered 2021-02-03: 30 mg via INTRAMUSCULAR
  Filled 2021-02-03: qty 1

## 2021-02-03 NOTE — ED Notes (Signed)
Pt to ED via POV c/o left hip pain that radiates down her leg. Pt is in NAD.

## 2021-02-03 NOTE — ED Notes (Signed)
Patient transported to X-ray 

## 2021-02-03 NOTE — ED Provider Notes (Signed)
[facility] Emergency Department Provider Note ____________________________________________  Time seen: 2135  I have reviewed the triage vital signs and the nursing notes.  HISTORY  Chief Complaint  Hip Pain  History limited by Spanish language.  Interpreter present for interview and exam.  HPI Briana Watson is a 35 y.o. female presented ED for evaluation of recurrent onset of low back pain with left lower extremity referral.  Patient describes a deep burning pain to the posterior left leg from the hip to the ankle.  He denies any recent injury, trauma, or fall.  She does note, that symptoms began back in March when she sustained a serious ground-level fall.  She has been evaluated in the past by her primary care provider and has completed a course of steroids and muscle relaxants.  She is recently been prescribed a course of high-dose ibuprofen, but denies any significant benefit.  She denies any bladder or bowel incontinence, foot drop, or saddle anesthesia.  She notes pain is worsened with transitioning from sit to stand and prolonged walking and standing.  History reviewed. No pertinent past medical history.  There are no problems to display for this patient.   Past Surgical History:  Procedure Laterality Date   CESAREAN SECTION     CHOLECYSTECTOMY      Prior to Admission medications   Medication Sig Start Date End Date Taking? Authorizing Provider  cyclobenzaprine (FLEXERIL) 5 MG tablet Take 1 tablet (5 mg total) by mouth 3 (three) times daily as needed. 02/03/21  Yes Elner Seifert, Charlesetta Ivory, PA-C  HYDROcodone-acetaminophen (NORCO) 5-325 MG tablet Take 1 tablet by mouth 3 (three) times daily as needed for up to 5 days. 02/04/21 02/09/21 Yes Witt Plitt, Charlesetta Ivory, PA-C  ketorolac (TORADOL) 10 MG tablet Take 1 tablet (10 mg total) by mouth every 8 (eight) hours. 02/03/21  Yes Jolynda Townley, Charlesetta Ivory, PA-C  predniSONE (DELTASONE) 20 MG tablet Take 3 tabs  daily x 2 days; Take 2 tabs daily x 3 days, Take 1 tab daily x 3 days, Take 0.5 tabs daily x 4 days 02/04/21  Yes Amya Hlad, Charlesetta Ivory, PA-C  albuterol (VENTOLIN HFA) 108 (90 Base) MCG/ACT inhaler Inhale 2 puffs into the lungs every 6 (six) hours as needed for wheezing or shortness of breath. 07/04/20   Merwyn Katos, MD  ibuprofen (ADVIL,MOTRIN) 800 MG tablet Take 1 tablet (800 mg total) by mouth every 8 (eight) hours as needed for moderate pain. 06/12/17   Irean Hong, MD  pantoprazole (PROTONIX) 20 MG tablet Take 1 tablet (20 mg total) by mouth daily. 02/06/15 02/06/16  Jene Every, MD    Allergies Patient has no known allergies.  History reviewed. No pertinent family history.  Social History Social History   Tobacco Use   Smoking status: Never   Smokeless tobacco: Never  Substance Use Topics   Alcohol use: No    Review of Systems  Constitutional: Negative for fever. Eyes: Negative for visual changes. ENT: Negative for sore throat. Cardiovascular: Negative for chest pain. Respiratory: Negative for shortness of breath. Gastrointestinal: Negative for abdominal pain, vomiting and diarrhea. Genitourinary: Negative for dysuria. Musculoskeletal: Negative for back pain. LLE pain as above Skin: Negative for rash. Neurological: Negative for headaches, focal weakness or numbness. ____________________________________________  PHYSICAL EXAM:  VITAL SIGNS: ED Triage Vitals  Enc Vitals Group     BP 02/03/21 1837 137/88     Pulse Rate 02/03/21 1837 98     Resp 02/03/21 1837 16  Temp 02/03/21 1837 98.4 F (36.9 C)     Temp Source 02/03/21 1837 Oral     SpO2 02/03/21 1837 98 %     Weight 02/03/21 1846 (!) 315 lb (142.9 kg)     Height 02/03/21 1846 5\' 3"  (1.6 m)     Head Circumference --      Peak Flow --      Pain Score 02/03/21 1845 10     Pain Loc --      Pain Edu? --      Excl. in GC? --     Constitutional: Alert and oriented. Well appearing and in no  distress. Head: Normocephalic and atraumatic. Eyes: Conjunctivae are normal. Normal extraocular movements Cardiovascular: Normal rate, regular rhythm. Normal distal pulses. Respiratory: Normal respiratory effort. No wheezes/rales/rhonchi. Gastrointestinal: Soft and nontender. No distention. Musculoskeletal: normal spinal alignment without midline tenderness. Normal transition from sit to stand. Tender to palp along posterior chain of LLE. Nontender with normal range of motion in all extremities.  Neurologic:  CN II-XII grossly intact. Normal LE DTRs. Normal gait without ataxia. Normal speech and language. No gross focal neurologic deficits are appreciated. Skin:  Skin is warm, dry and intact. No rash noted. Psychiatric: Mood and affect are normal. Patient exhibits appropriate insight and judgment. ____________________________________________   LABS (pertinent positives/negatives) Labs Reviewed  POC URINE PREG, ED  ____________________________________________   RADIOLOGY  DG Lumbar Spine  FINDINGS: Normal alignment. Degenerative facet disease in the lower lumbar spine. Transitional anatomy at the lumbosacral junction. No fracture or malalignment.  I, 04/05/21, personally viewed and evaluated these images (plain radiographs) as part of my medical decision making, as well as reviewing the written report by the radiologist. ____________________________________________  PROCEDURES  Toradol 30 mg IM Norflex 60 mg IM Percocet 5-325 mg PO  Procedures ____________________________________________   INITIAL IMPRESSION / ASSESSMENT AND PLAN / ED COURSE  As part of my medical decision making, I reviewed the following data within the electronic MEDICAL RECORD NUMBER Interpreter needed, Labs reviewed WNL, Old chart reviewed, Radiograph reviewed as above, Notes from prior ED visits, and [place] Controlled Substance Database   DDX: sciatica, DDD, lumbar strain  ED evaluation of  recurrent low back pain and left-sided radicular symptoms.  Patient presents for evaluation of her complaint, with no recent improvement after a course of ibuprofen.  She was evaluated for complaint with x-rays which did reveal a transitional L5 vertebrae with some significant degenerative disc disease at that level.  This likely represents the patient's sciatic nerve irritation on the left.  No red flags on exam.  Patient is neuro muscularly intact, and stable for discharge at this time.  She does note improvement of her pain from a 10/10 to a 7/10 at time of this disposition patient be discharged with a prescription for Flexeril, hydrocodone, ketorolac, prednisone.  She is encouraged to take the ketorolac initially, and then transition to the steroid.  She will follow-up with primary provider for ongoing management and likely referral if necessary, to Ortho spine or neurology.  Return precautions have been discussed.  Briana Watson was evaluated in Emergency Department on 02/04/2021 for the symptoms described in the history of present illness. She was evaluated in the context of the global COVID-19 pandemic, which necessitated consideration that the patient might be at risk for infection with the SARS-CoV-2 virus that causes COVID-19. Institutional protocols and algorithms that pertain to the evaluation of patients at risk for COVID-19 are in a state of rapid  change based on information released by regulatory bodies including the CDC and federal and state organizations. These policies and algorithms were followed during the patient's care in the ED.  I reviewed the patient's prescription history over the last 12 months in the multi-state controlled substances database(s) that includes [place], [place], [place], [place], [place], [place], [place], [place], [place], [place], [place], [place], [place], and [place].  Results were notable for no RX  history. ____________________________________________  FINAL CLINICAL IMPRESSION(S) / ED DIAGNOSES  Final diagnoses:  Sciatica of left side      Karmen Stabs, Charlesetta Ivory, PA-C 02/04/21 0042    Sharman Cheek, MD 02/04/21 2357

## 2021-02-03 NOTE — ED Notes (Signed)
Patient aware of need for urine sample. Patient verbalized understanding.  

## 2021-02-03 NOTE — ED Triage Notes (Signed)
Pt via POV from home. Pt c/o L sided back pain that radiates down her L leg since Thursday. Pt was prescribed ibuprofen and muscle relaxant after a fall in March at [facility] Pt states the pain has come back. Pt is A&Ox4 and NAD.

## 2021-02-03 NOTE — Discharge Instructions (Addendum)
Su examen y radiografa son consistentes con citica. Esta es una irritacin de los nervios de la parte baja de la espalda, que afecta la pierna izquierda. Tome el medicamento recetado para Chief Technology Officer, el relajante muscular y el Toradol (ketorolaco) segn las indicaciones. A continuacin, puede tomar el esteroide y [employer] el ibuprofeno que le recetaron la semana pasada. Haga un seguimiento con su proveedor para recibir atencin continua. Regrese al servicio de urgencias segn sea necesario.   Your exam and X-ray are consistent with sciatica. This is nerve irritation from the lower back, that affects the left leg. Take the prescription pain medicine, muscle relaxant, and the Toradol (ketorolac) as directed. You may then take the steroid next, and then the Ibuprofen you were prescribed last week. Follow-up with your provider for continued care. Return to the ED as needed.

## 2021-02-03 NOTE — ED Notes (Signed)
Patient reports pain to the L hip. Patient reports pain radiates down her left leg. Patient reports the pain started Thursday. The patient reports taking medications at home without relief. Patient reports she has been taking a muscle relaxer, Ibuprofen, and Prednisone without improvement. Patient ambulatory with difficulty, but a steady gait.

## 2021-02-04 MED ORDER — PREDNISONE 20 MG PO TABS: ORAL_TABLET | ORAL | 0 refills | Status: AC

## 2021-02-04 MED ORDER — HYDROCODONE-ACETAMINOPHEN 5-325 MG PO TABS: 1.0000 | ORAL_TABLET | Freq: Three times a day (TID) | ORAL | 0 refills | Status: AC | PRN

## 2021-02-04 MED ORDER — OXYCODONE-ACETAMINOPHEN 5-325 MG PO TABS
1.0000 | ORAL_TABLET | Freq: Once | ORAL | Status: AC
  Administered 2021-02-04: 1 via ORAL
  Filled 2021-02-04: qty 1

## 2021-02-04 NOTE — ED Notes (Signed)
ED Provider at bedside. 

## 2021-02-04 NOTE — ED Notes (Signed)
Patient provided discharge instructions in Spanish. Patient denies questions or concerns. Patient discharged to lobby awaiting ride from family via POV. Patient verbalized understanding of discharge instructions, follow up care, and prescriptions.

## 2022-01-14 IMAGING — CR DG LUMBAR SPINE COMPLETE 4+V
1 series · 6 of 6 positions shown · non-contrast
Comparison: None

CLINICAL DATA: Recurrent low back pain

EXAM:
LUMBAR SPINE - COMPLETE 4+ VIEW

[Series 1: dg lumbar spine complete 4 +v · 0.14mm/px · 6 of 6 slices shown]
[im 1/6]
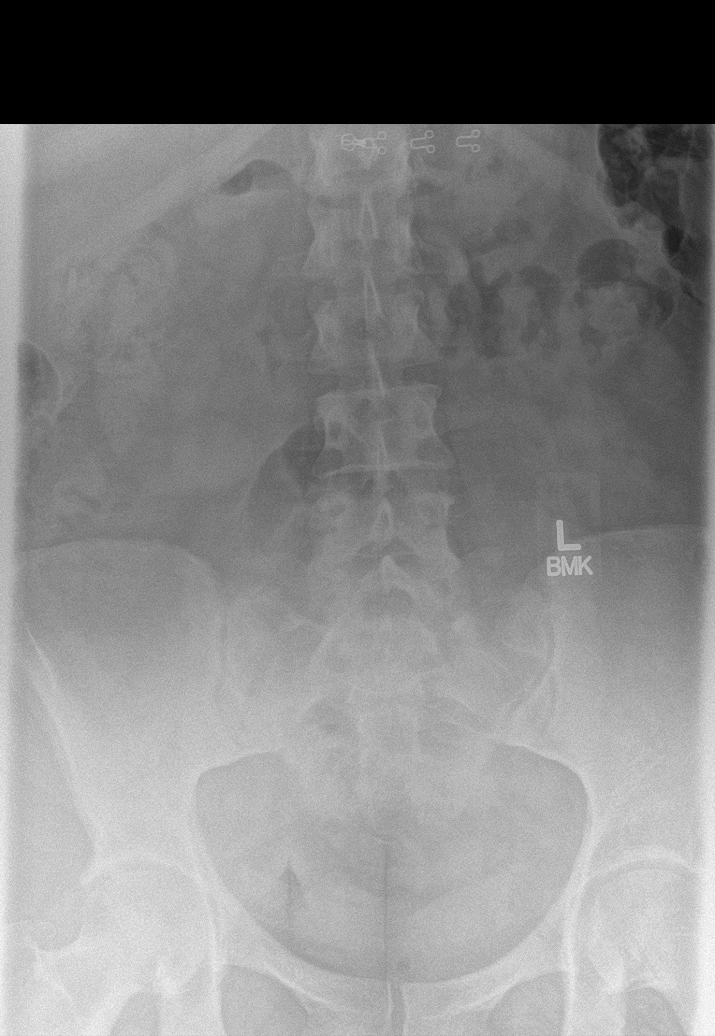
[im 2/6]
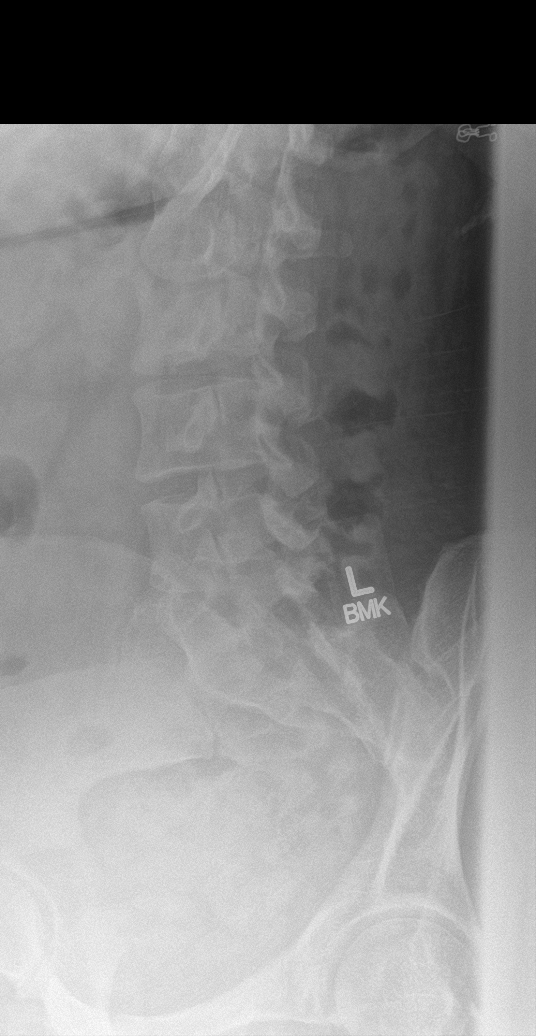
[im 3/6]
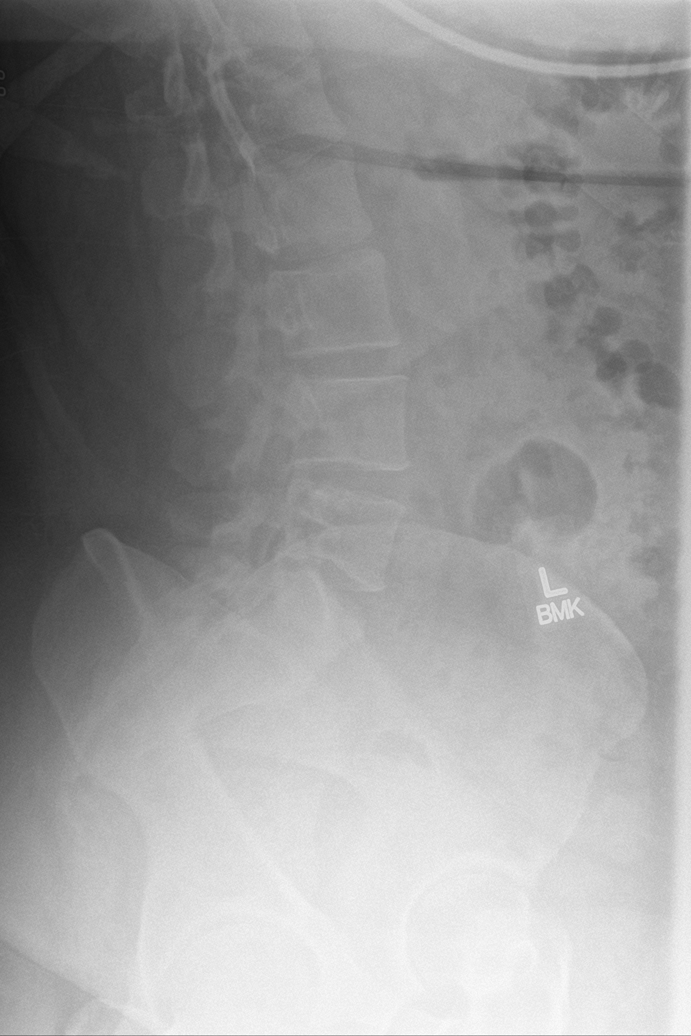
[im 4/6]
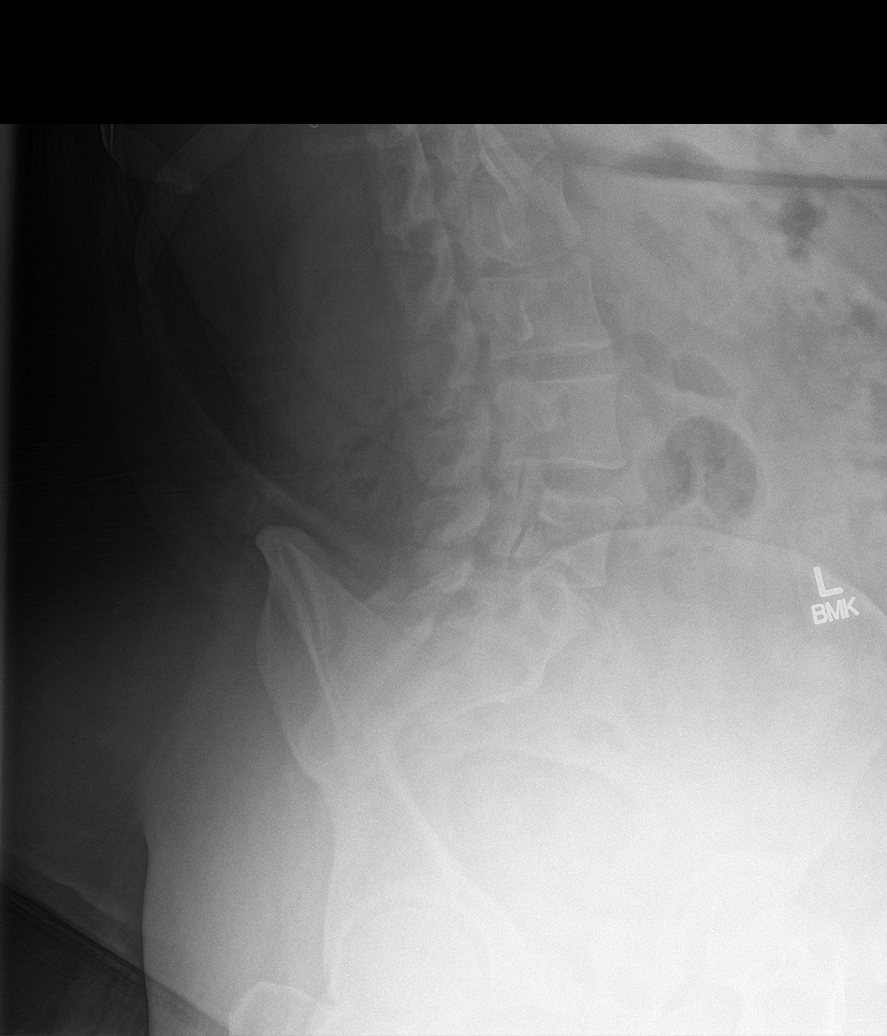
[im 5/6]
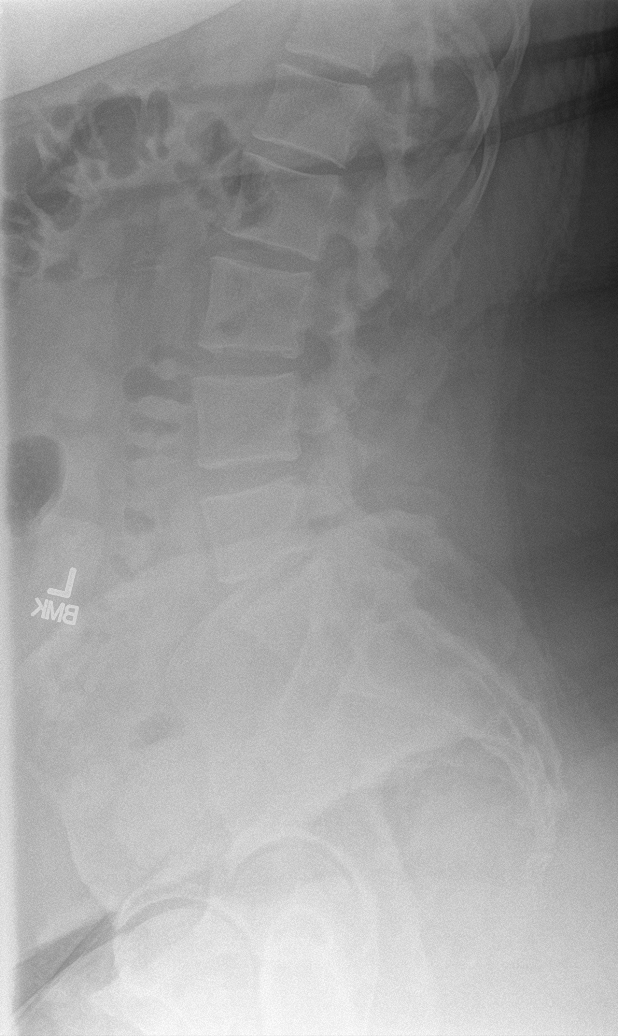
[im 6/6]
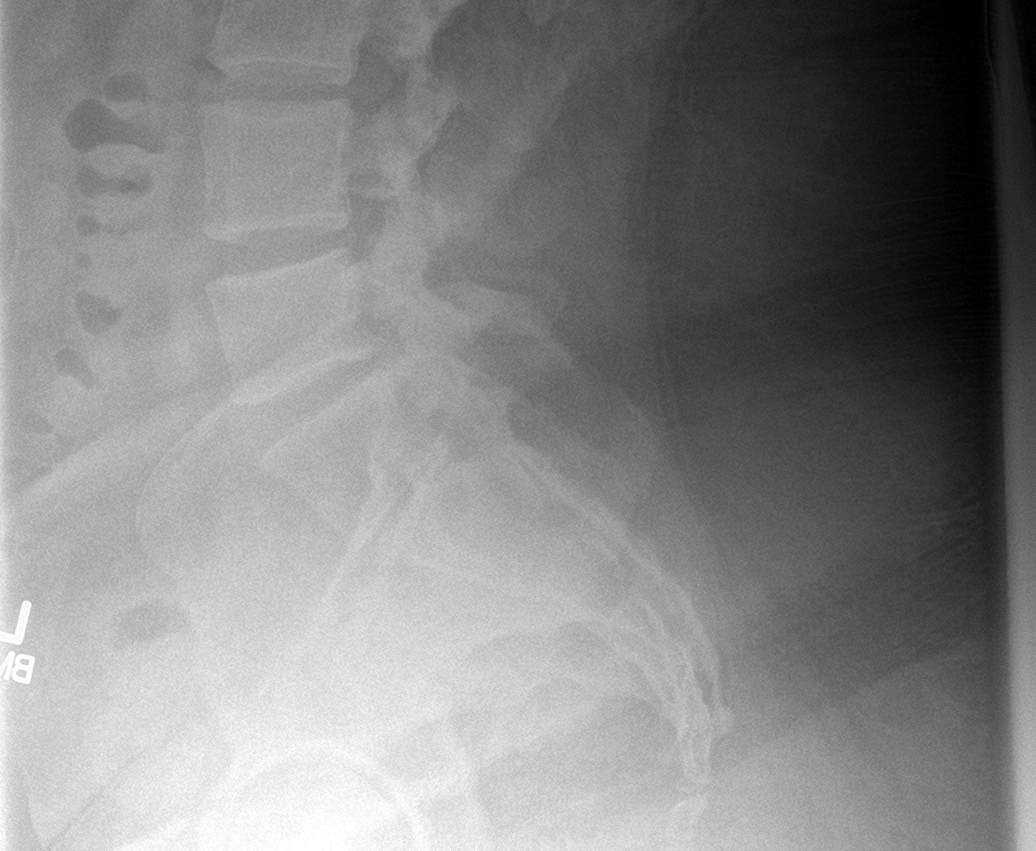

[6 of 6 positions shown; findings below may reference images not displayed]

FINDINGS: Normal alignment. Degenerative facet disease in the lower lumbar
spine. Transitional anatomy at the lumbosacral junction. No fracture
or malalignment.
IMPRESSION: No acute bony abnormality.

## 2024-02-16 ENCOUNTER — Emergency Department: Payer: Self-pay

## 2024-02-16 ENCOUNTER — Emergency Department
Admission: EM | Admit: 2024-02-16 | Discharge: 2024-02-16 | Disposition: A | Discharge status: Home / Self Care | Payer: Worker's Compensation | Attending: Emergency Medicine | Admitting: Emergency Medicine

## 2024-02-16 ENCOUNTER — Encounter: Payer: Self-pay | Admitting: Intensive Care

## 2024-02-16 ENCOUNTER — Emergency Department: Payer: Worker's Compensation

## 2024-02-16 ENCOUNTER — Other Ambulatory Visit: Payer: Self-pay

## 2024-02-16 DIAGNOSIS — W19XXXA Unspecified fall, initial encounter: Secondary | ICD-10-CM

## 2024-02-16 DIAGNOSIS — W11XXXA Fall on and from ladder, initial encounter: Secondary | ICD-10-CM | POA: Insufficient documentation

## 2024-02-16 DIAGNOSIS — M5440 Lumbago with sciatica, unspecified side: Secondary | ICD-10-CM | POA: Diagnosis not present

## 2024-02-16 DIAGNOSIS — Y990 Civilian activity done for income or pay: Secondary | ICD-10-CM | POA: Diagnosis not present

## 2024-02-16 DIAGNOSIS — S30811A Abrasion of abdominal wall, initial encounter: Secondary | ICD-10-CM | POA: Diagnosis not present

## 2024-02-16 DIAGNOSIS — M79631 Pain in right forearm: Secondary | ICD-10-CM | POA: Insufficient documentation

## 2024-02-16 DIAGNOSIS — M79672 Pain in left foot: Secondary | ICD-10-CM | POA: Diagnosis not present

## 2024-02-16 DIAGNOSIS — S3991XA Unspecified injury of abdomen, initial encounter: Secondary | ICD-10-CM | POA: Diagnosis present

## 2024-02-16 LAB — COMPREHENSIVE METABOLIC PANEL WITH GFR
ALT: 12 U/L (ref 0–44)
AST: 31 U/L (ref 15–41)
Albumin: 3.8 g/dL (ref 3.5–5.0)
Alkaline Phosphatase: 67 U/L (ref 38–126)
Anion gap: 11 (ref 5–15)
BUN: 16 mg/dL (ref 6–20)
CO2: 22 mmol/L (ref 22–32)
Calcium: 9.4 mg/dL (ref 8.9–10.3)
Chloride: 103 mmol/L (ref 98–111)
Creatinine, Ser: 0.5 mg/dL (ref 0.44–1.00)
GFR, Estimated: >60 mL/min (ref >60)
Glucose, Bld: 210 mg/dL — ABNORMAL HIGH (ref 70–99)
Potassium: 4.3 mmol/L (ref 3.5–5.1)
Sodium: 136 mmol/L (ref 135–145)
Total Bilirubin: 1.2 mg/dL (ref 0.0–1.2)
Total Protein: 7.6 g/dL (ref 6.5–8.1)

## 2024-02-16 LAB — CBC WITH DIFFERENTIAL/PLATELET
Abs Immature Granulocytes: 0.04 10*3/uL (ref 0.00–0.07)
Basophils Absolute: 0 10*3/uL (ref 0.0–0.1)
Basophils Relative: 0 %
Eosinophils Absolute: 0.1 10*3/uL (ref 0.0–0.5)
Eosinophils Relative: 1 %
HCT: 37.4 % (ref 36.0–46.0)
Hemoglobin: 12.5 g/dL (ref 12.0–15.0)
Immature Granulocytes: 0 %
Lymphocytes Relative: 31 %
Lymphs Abs: 3 10*3/uL (ref 0.7–4.0)
MCH: 29.1 pg (ref 26.0–34.0)
MCHC: 33.4 g/dL (ref 30.0–36.0)
MCV: 87 fL (ref 80.0–100.0)
Monocytes Absolute: 0.5 10*3/uL (ref 0.1–1.0)
Monocytes Relative: 5 %
Neutro Abs: 6.1 10*3/uL (ref 1.7–7.7)
Neutrophils Relative %: 63 %
Platelets: 331 10*3/uL (ref 150–400)
RBC: 4.3 MIL/uL (ref 3.87–5.11)
RDW: 12.8 % (ref 11.5–15.5)
WBC: 9.7 10*3/uL (ref 4.0–10.5)
nRBC: 0 % (ref 0.0–0.2)

## 2024-02-16 LAB — HCG, QUANTITATIVE, PREGNANCY: hCG, Beta Chain, Quant, S: <1 m[IU]/mL (ref <5)

## 2024-02-16 LAB — POC URINE PREG, ED: Preg Test, Ur: NEGATIVE

## 2024-02-16 MED ORDER — METHOCARBAMOL 500 MG PO TABS: 500.0000 mg | ORAL_TABLET | Freq: Three times a day (TID) | ORAL | 0 refills | Status: AC | PRN

## 2024-02-16 MED ORDER — OXYCODONE-ACETAMINOPHEN 5-325 MG PO TABS
1.0000 | ORAL_TABLET | Freq: Once | ORAL | Status: AC
  Administered 2024-02-16: 1 via ORAL
  Filled 2024-02-16: qty 1

## 2024-02-16 MED ORDER — IOHEXOL 350 MG/ML SOLN
100.0000 mL | Freq: Once | INTRAVENOUS | Status: AC | PRN
  Administered 2024-02-16: 100 mL via INTRAVENOUS

## 2024-02-16 NOTE — ED Provider Notes (Signed)
 [facility] Provider Note    Event Date/Time   First MD Initiated Contact with Patient 02/16/24 1642     (approximate)   History   Fall   HPI  Briana Watson is a 38 y.o. female presenting to the emergency department following a fall that occurred at work.  Patient states she was standing on the ground and her coworker was on a ladder above her trying to pick up a big roll of plastic when the coworker fell off the ladder and the patient tried to catch the plastic and the plastic ended up falling on top of her as well as the ladder onto her her abdominal region.  Her coworker fell on the other side of the ladder. She is having pain in her right forearm, left foot, lower back, and abdominal region.  She denies hitting her head, LOC, vomiting, nausea.  She is filing Worker's Comp, but does not have the paperwork with her.  No pertinent past medical history.  Past surgical history includes cholecystectomy.   Physical Exam   Triage Vital Signs: ED Triage Vitals [02/16/24 1504]  Encounter Vitals Group     BP (!) 145/91     Girls Systolic BP Percentile      Girls Diastolic BP Percentile      Boys Systolic BP Percentile      Boys Diastolic BP Percentile      Pulse Rate 95     Resp 16     Temp 98.4 F (36.9 C)     Temp Source Oral     SpO2 98 %     Weight (!) 330 lb (149.7 kg)     Height 5' 5 (1.651 m)     Head Circumference      Peak Flow      Pain Score 7     Pain Loc      Pain Education      Exclude from Growth Chart     Most recent vital signs: Vitals:   02/16/24 1504 02/16/24 1811  BP: (!) 145/91 138/88  Pulse: 95 88  Resp: 16 16  Temp: 98.4 F (36.9 C)   SpO2: 98% 98%     General: Awake, in no acute distress.  Head: Normocephalic, atraumatic. Eyes: No scleral icterus or conjunctival injection. CV: Regular rate, 95 bpm. Peripheral pulses 2+ and symmetric. No edema. Respiratory: No respiratory distress. Normal respiratory  effort. GI: Soft, non-distended.  Tender to palpation in left and right lower quadrants with guarding.  MSK: Tender to palpation along the lateral malleolus of the left ankle, posterior right forearm.  Has increased pain with plantar and dorsiflexion of the left ankle, pronation of the right forearm.  No obvious deformities or swelling of the right forearm or left ankle.  5/5 strength in left forearm and wrist, right ankle and 4/5 strength in right forearm and wrist and left ankle. Skin:Warm, dry, intact.  Abrasion noted between the umbilicus and left lower quadrant.  Neurological: A&Ox4 to person, place, time, and situation.  Psychiatric: Mood and affect appropriate. Thought processes coherent.   ED Results / Procedures / Treatments   Labs (all labs ordered are listed, but only abnormal results are displayed) Labs Reviewed  COMPREHENSIVE METABOLIC PANEL WITH GFR - Abnormal; Notable for the following components:      Result Value   Glucose, Bld 210 (*)    All other components within normal limits  CBC WITH DIFFERENTIAL/PLATELET  HCG, QUANTITATIVE, PREGNANCY  POC  URINE PREG, ED     EKG     RADIOLOGY X-rays of the right forearm, left foot, and CTs of the lumbar spine and abdomen pelvis ordered.  Right forearm and left foot without any acute fractures or dislocations.  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   CT abdomen pelvis IMPRESSION: 1. No acute traumatic injury in the abdomen or pelvis. 2. 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 3. Hepatic steatosis.  X rays was independently viewed and interpreted by me as well as the radiologist. I agree with the radiologist's reports.  I independently reviewed the radiologist reports for the CT scans.     PROCEDURES:  Critical Care performed: No  Procedures   MEDICATIONS ORDERED IN ED: Medications  oxyCODONE -acetaminophen  (PERCOCET/ROXICET) 5-325 MG per tablet 1 tablet (1 tablet Oral Given 02/16/24 1814)  iohexol (OMNIPAQUE) 350 MG/ML injection 100 mL (100 mLs Intravenous Contrast Given 02/16/24 1827)     IMPRESSION / MDM / ASSESSMENT AND PLAN / ED COURSE  I reviewed the triage vital signs and the nursing notes.                              Differential diagnosis includes, but is not limited to, mechanical fall with Worker's Comp, intestinal perforation or spleen laceration, radial or ulnar fracture, ankle sprain  Patient's presentation is most consistent with acute complicated illness / injury requiring diagnostic workup.  Patient is a 38 year old female who presented today following a mechanical fall that occurred at work.  She states she is applying for [employer] but did not bring the paperwork.  1 dose of Percocet given in the emergency department for pain.  Reevaluation shows improvement in pain. CBC and CMP ordered, labs reassuring.  X-rays of her right forearm and left foot without acute fracture or dislocation.  CT of the lumbar spine with no acute findings. CT scan of the abdomen pelvis revealed hepatic steatosis and an incidental finding of a 5.8 x 2.9 cm lucent lesion in the left intertrochanteric femur, radiologist states it is likely benign.  I will have her follow-up with her primary care provider regarding these findings as well as to complete Worker's Comp paperwork since she did not bring it here.  She has an ibuprofen  allergy so I did not prescribe her NSAID.  I did provide her with a muscle relaxer that can help with any acute pain.  We did discuss not operating a motor vehicle, working, or making wild binding decisions while taking this medication.  She was also given a ASO ankle brace to help with stability.  Patient was given the opportunity to ask questions,  all questions were answered vital signs within normal range.  Emergency department return precautions were discussed with the patient.  Patient is in agreement to the treatment plan.  Patient is stable for discharge.    FINAL CLINICAL IMPRESSION(S) / ED DIAGNOSES   Final diagnoses:  Fall, initial encounter     Rx / DC Orders   ED Discharge Orders          Ordered    methocarbamol (ROBAXIN) 500 MG tablet  Every 8 hours PRN        02/16/24 1951  Note:  This document was prepared using Dragon voice recognition software and may include unintentional dictation errors.    Sheron Salm, PA-C 02/16/24 2013    Suzanne Kirsch, MD 02/16/24 [phone]

## 2024-02-16 NOTE — ED Triage Notes (Signed)
 Patient had fall at work and c/o left foot, left knee, right hand to elbow, and lower back pain. Denies hitting head or LOC  Filing workmans comp

## 2024-02-16 NOTE — Discharge Instructions (Addendum)
 You have been seen in the Emergency Department (ED) today for a fall.  Your work up does not show any concerning injuries.  Please take over-the-counter Tylenol  as needed for your pain , or take any prescribed medication as instructed.  Please do not drive, work, or make legal decisions while on this medication.  There was an incidental finding on your exam showing a lesion of your left femur.  It is benign, meaning it does not need any interventions at this time.   Please follow up with your doctor regarding today's Emergency Department (ED) visit and your recent fall.    Return to the ED if you have any headache, confusion, slurred speech, weakness/numbness of any arm or leg, or any increased pain.

## 2024-03-26 DIAGNOSIS — M545 Low back pain, unspecified: Secondary | ICD-10-CM | POA: Insufficient documentation

## 2024-03-26 DIAGNOSIS — M5416 Radiculopathy, lumbar region: Secondary | ICD-10-CM | POA: Insufficient documentation

## 2024-05-20 DIAGNOSIS — G5621 Lesion of ulnar nerve, right upper limb: Secondary | ICD-10-CM | POA: Insufficient documentation

## 2024-05-26 ENCOUNTER — Other Ambulatory Visit: Payer: Self-pay | Admitting: Physician Assistant

## 2024-05-26 DIAGNOSIS — M545 Low back pain, unspecified: Secondary | ICD-10-CM

## 2024-05-26 DIAGNOSIS — M5416 Radiculopathy, lumbar region: Secondary | ICD-10-CM

## 2024-06-07 ENCOUNTER — Telehealth: Payer: Self-pay

## 2024-06-07 ENCOUNTER — Ambulatory Visit
Admission: RE | Admit: 2024-06-07 | Discharge: 2024-06-07 | Disposition: A | Discharge status: Home / Self Care | Payer: Worker's Compensation | Source: Ambulatory Visit | Attending: Physician Assistant | Admitting: Physician Assistant

## 2024-06-07 DIAGNOSIS — M5416 Radiculopathy, lumbar region: Secondary | ICD-10-CM

## 2024-06-07 DIAGNOSIS — M545 Low back pain, unspecified: Secondary | ICD-10-CM

## 2024-06-07 NOTE — Telephone Encounter (Signed)
 Outside message (copy) sent to MRI (ordering) provider. See below:
